# Patient Record
Sex: Male | Born: 1985 | Race: White | Hispanic: No | Marital: Married
Health system: Southern US, Community
[De-identification: ages and names within clinical notes are randomized; demographics above are authoritative.]

## PROBLEM LIST (undated history)

## (undated) DIAGNOSIS — J309 Allergic rhinitis, unspecified: Secondary | ICD-10-CM

## (undated) HISTORY — PX: VASECTOMY: SHX75

## (undated) HISTORY — DX: Allergic rhinitis, unspecified: J30.9

---

## 2017-09-04 DIAGNOSIS — J302 Other seasonal allergic rhinitis: Secondary | ICD-10-CM | POA: Insufficient documentation

## 2018-04-02 ENCOUNTER — Ambulatory Visit (INDEPENDENT_AMBULATORY_CARE_PROVIDER_SITE_OTHER): Payer: BLUE CROSS/BLUE SHIELD

## 2018-04-02 ENCOUNTER — Other Ambulatory Visit: Payer: Self-pay | Admitting: Podiatry

## 2018-04-02 ENCOUNTER — Ambulatory Visit (INDEPENDENT_AMBULATORY_CARE_PROVIDER_SITE_OTHER): Payer: BLUE CROSS/BLUE SHIELD | Admitting: Podiatry

## 2018-04-02 DIAGNOSIS — M7661 Achilles tendinitis, right leg: Secondary | ICD-10-CM

## 2018-04-02 DIAGNOSIS — S99921A Unspecified injury of right foot, initial encounter: Secondary | ICD-10-CM

## 2018-04-02 MED ORDER — METHYLPREDNISOLONE 4 MG PO TBPK: ORAL_TABLET | ORAL | 0 refills | Status: DC

## 2018-04-02 MED ORDER — MELOXICAM 15 MG PO TABS: 15.0000 mg | ORAL_TABLET | Freq: Every day | ORAL | 1 refills | Status: AC

## 2018-04-05 NOTE — Progress Notes (Signed)
   HPI: 32 year old male presenting today as a new patient with a chief complaint of pain to the achilles area of the right lower extremity that began 2 days ago. He reports associated redness and swelling. Bearing weight and walking increases the pain. He has not had any treatment of the symptoms. Patient is here for further evaluation and treatment.   No past medical history on file.    Physical Exam: General: The patient is alert and oriented x3 in no acute distress.  Dermatology: Skin is warm, dry and supple bilateral lower extremities. Negative for open lesions or macerations.  Vascular: Palpable pedal pulses bilaterally. No edema or erythema noted. Capillary refill within normal limits.  Neurological: Epicritic and protective threshold grossly intact bilaterally.   Musculoskeletal Exam: Pain on palpation noted to the posterior tubercle of the right calcaneus at the insertion of the Achilles tendon consistent with retrocalcaneal bursitis. Range of motion within normal limits. Muscle strength 5/5 in all muscle groups bilateral lower extremities.  Radiographic Exam:  Posterior calcaneal spur noted to the respective calcaneus on lateral view. No fracture or dislocation noted. Normal osseous mineralization noted.     Assessment: 1. Insertional Achilles tendinitis right  2. Retrocalcaneal bursitis   Plan of Care:  1. Patient was evaluated. Radiographs were reviewed today. 2. Prescription for Medrol Dose Pak provided to patient.  3. Prescription for Meloxicam provided to patient.  4. Recommended good shoe gear.  5. Return to clinic as needed.    Felecia Shelling, DPM [facility]  Dr. Felecia Shelling, DPM    [address]                                        [place], [place] [address]                Office [phone]  Fax [phone]

## 2018-04-23 ENCOUNTER — Ambulatory Visit (INDEPENDENT_AMBULATORY_CARE_PROVIDER_SITE_OTHER): Payer: BLUE CROSS/BLUE SHIELD | Admitting: Podiatry

## 2018-04-23 DIAGNOSIS — M7661 Achilles tendinitis, right leg: Secondary | ICD-10-CM

## 2018-04-24 NOTE — Progress Notes (Signed)
   HPI: 32 year old male presenting today for follow up evaluation of insertional achilles tendinitis of the right lower extremity. He reports some improvement in the pain but states it is not completely resolved. Walking for long periods of time make the symptoms worse. He has been taking Meloxicam as directed which seems to help alleviate the pain. Patient is here for further evaluation and treatment.   No past medical history on file.    Physical Exam: General: The patient is alert and oriented x3 in no acute distress.  Dermatology: Skin is warm, dry and supple bilateral lower extremities. Negative for open lesions or macerations.  Vascular: Palpable pedal pulses bilaterally. No edema or erythema noted. Capillary refill within normal limits.  Neurological: Epicritic and protective threshold grossly intact bilaterally.   Musculoskeletal Exam: Pain on palpation noted to the posterior tubercle of the right calcaneus at the insertion of the Achilles tendon consistent with retrocalcaneal bursitis. Range of motion within normal limits. Muscle strength 5/5 in all muscle groups bilateral lower extremities.   Assessment: 1. Insertional Achilles tendinitis right - improved  2. Retrocalcaneal bursitis - improved    Plan of Care:  1. Patient was evaluated.  2. Continue taking Meloxicam. 3. Silicone heel sleeve dispensed.  4. Discussed physical therapy, injections or CAM boot if symptoms worsen.  5. Return to clinic as needed.   Felecia Shelling, DPM [facility]  Dr. Felecia Shelling, DPM    [address]                                        [place], [place] [address]                Office [phone]  Fax [phone]

## 2021-02-07 ENCOUNTER — Other Ambulatory Visit: Payer: Self-pay | Admitting: Infectious Diseases

## 2021-02-07 DIAGNOSIS — R7401 Elevation of levels of liver transaminase levels: Secondary | ICD-10-CM

## 2021-02-15 ENCOUNTER — Ambulatory Visit: Payer: BLUE CROSS/BLUE SHIELD

## 2021-02-24 ENCOUNTER — Other Ambulatory Visit: Payer: Self-pay

## 2021-02-24 ENCOUNTER — Ambulatory Visit
Admission: RE | Admit: 2021-02-24 | Discharge: 2021-02-24 | Disposition: A | Discharge status: Home / Self Care | Payer: BC Managed Care – PPO | Source: Ambulatory Visit | Attending: Infectious Diseases | Admitting: Infectious Diseases

## 2021-02-24 DIAGNOSIS — R7401 Elevation of levels of liver transaminase levels: Secondary | ICD-10-CM | POA: Insufficient documentation

## 2021-08-24 DIAGNOSIS — S0502XA Injury of conjunctiva and corneal abrasion without foreign body, left eye, initial encounter: Secondary | ICD-10-CM | POA: Insufficient documentation

## 2022-04-05 IMAGING — US US ABDOMEN LIMITED RUQ/ASCITES
1 series · 14 of 25 positions shown · non-contrast
Comparison: None.

CLINICAL DATA: Elevated ALT measurement

EXAM:
ULTRASOUND ABDOMEN LIMITED RIGHT UPPER QUADRANT

[Series 1: us abdomen limited ruq (liver/gb) · 14 of 55 slices shown]
[im 1/55]
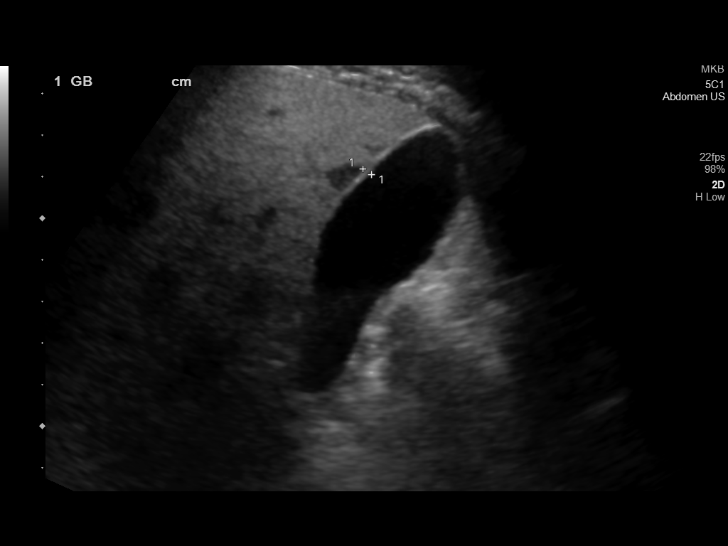
[im 5/55]
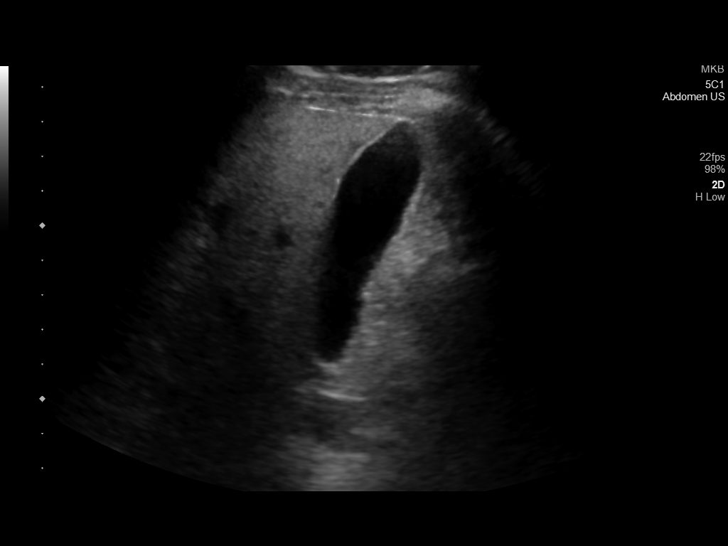
[im 10/55]
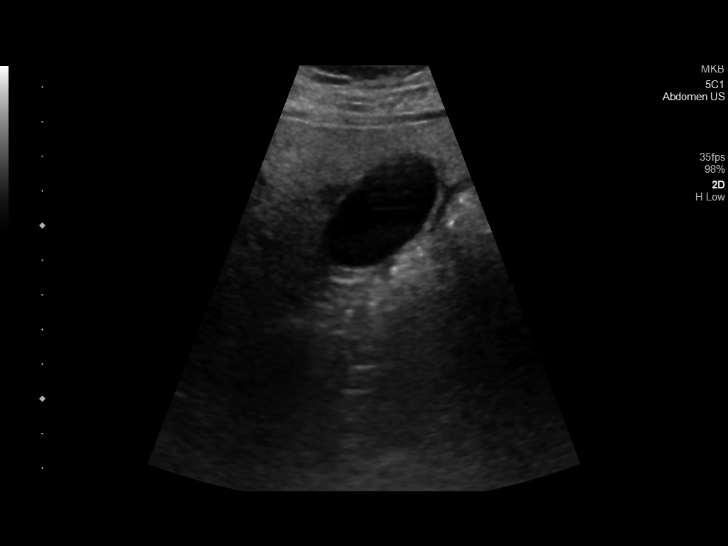
[im 14/55]
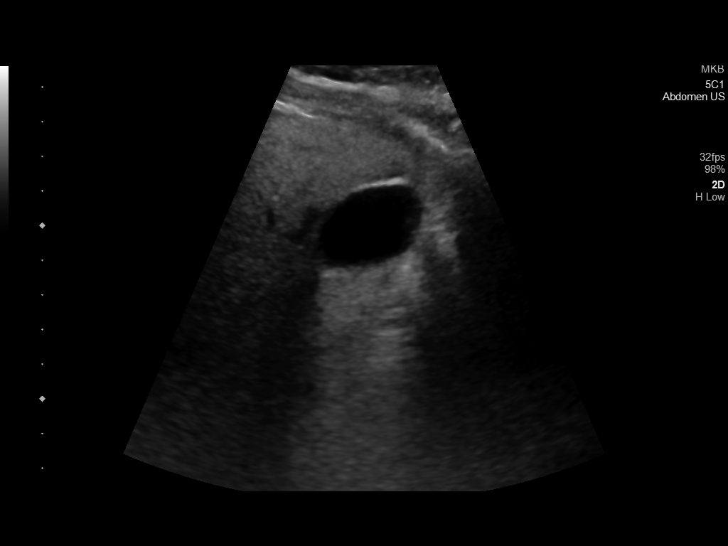
[im 19/55]
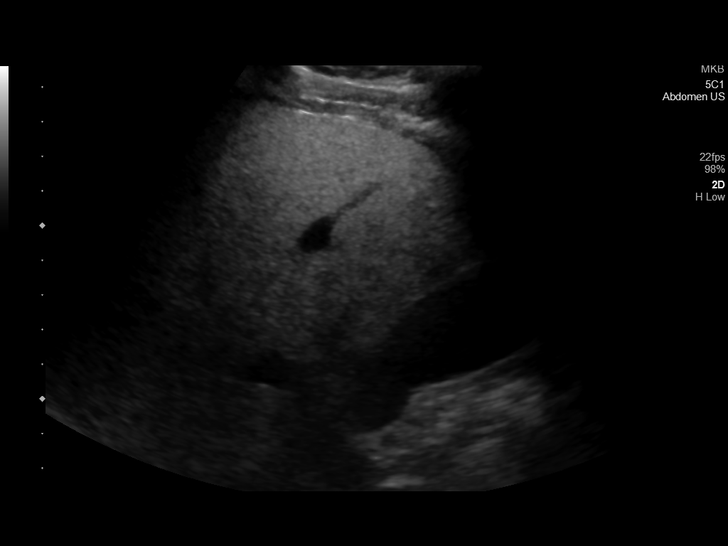
[im 21/55]
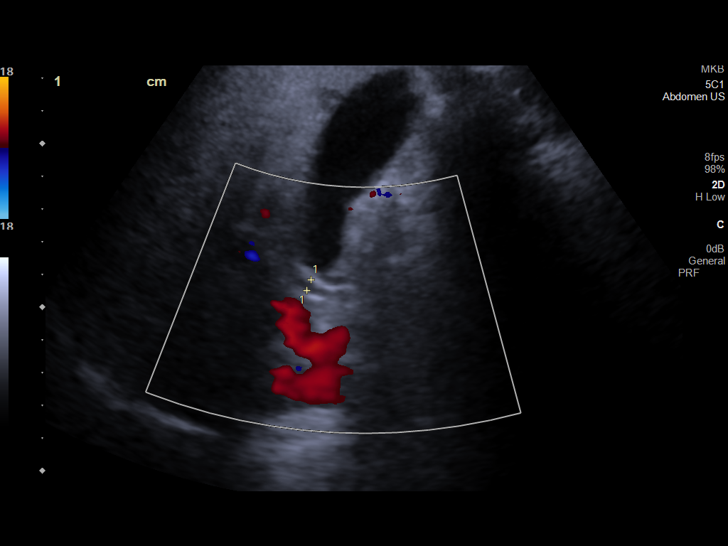
[im 25/55]
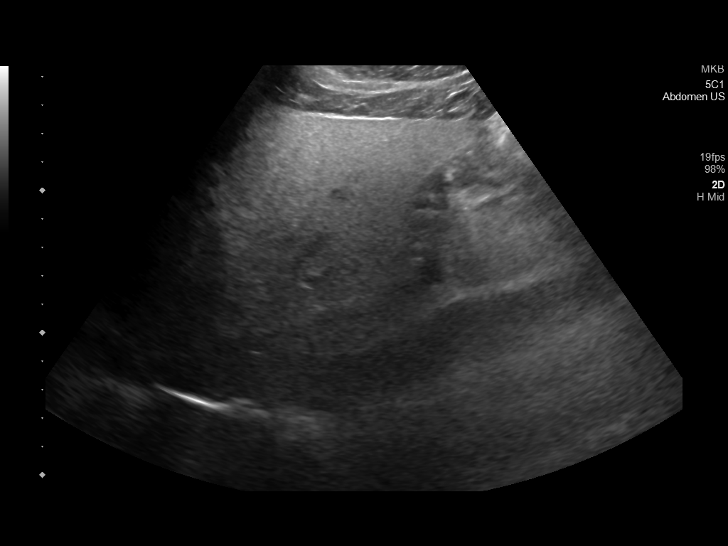
[im 30/55]
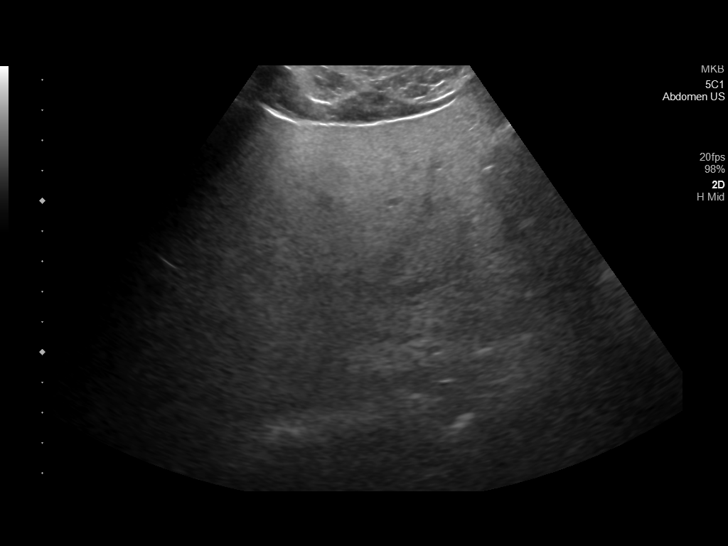
[im 34/55]
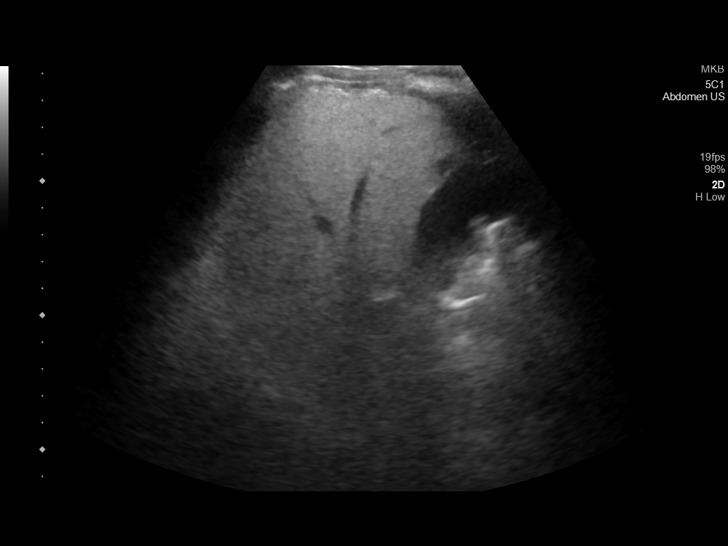
[im 37/55]
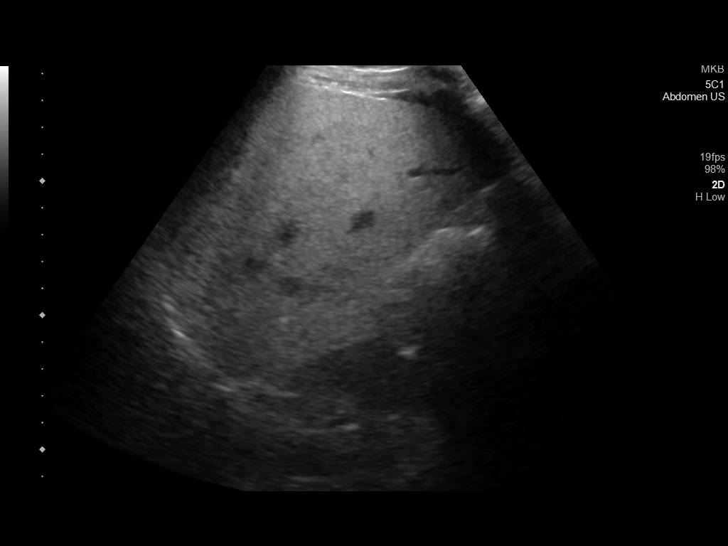
[im 41/55]
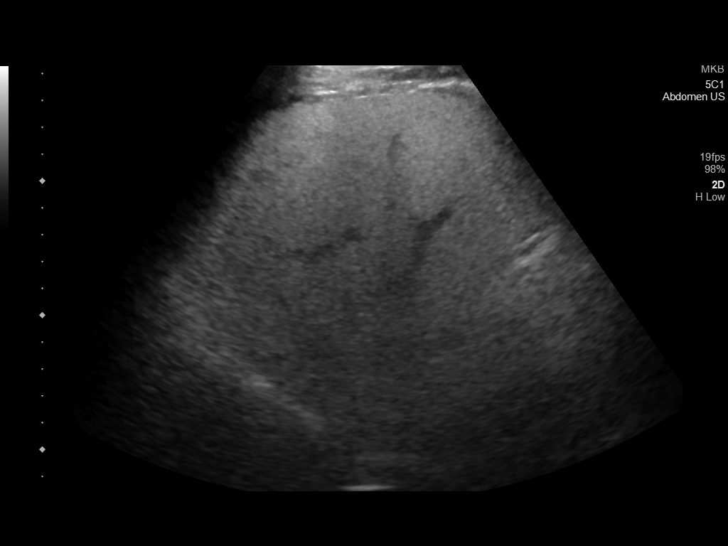
[im 46/55]
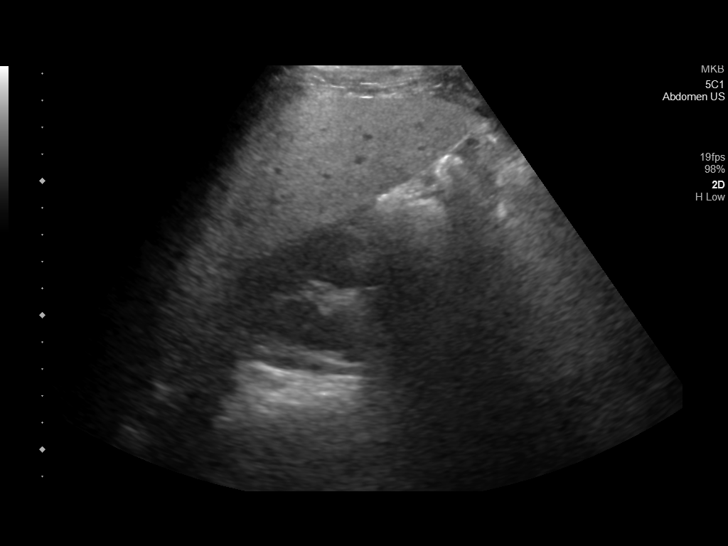
[im 50/55]
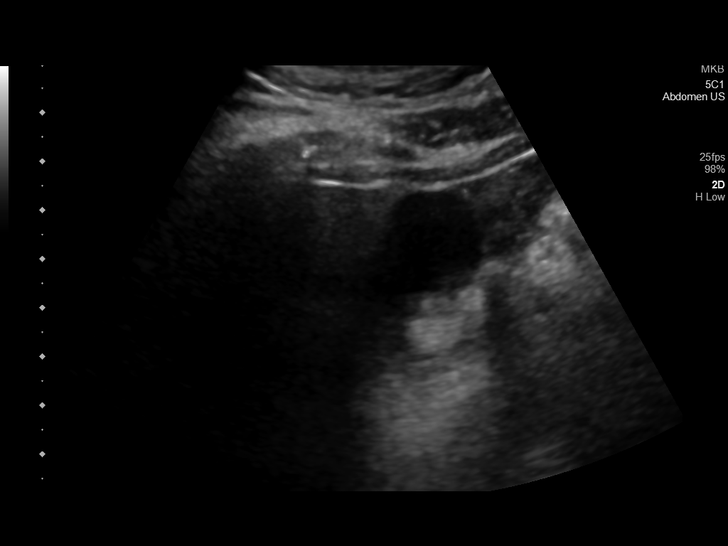
[im 55/55]
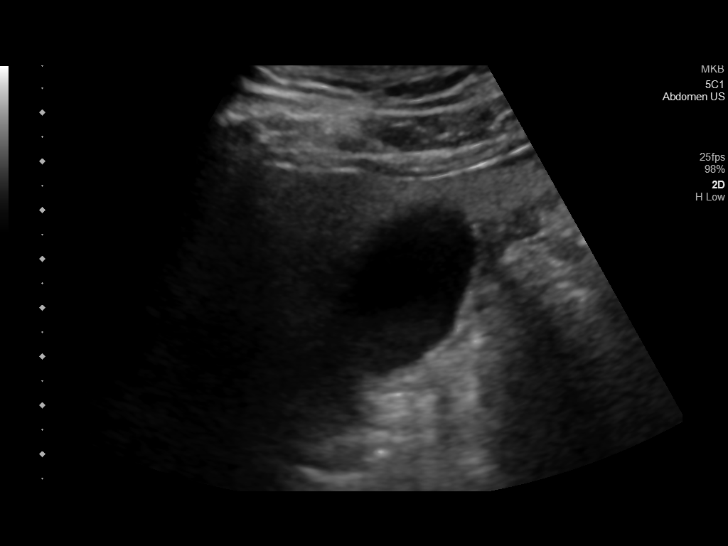

[14 of 25 positions shown; findings below may reference images not displayed]

FINDINGS: Gallbladder:

No gallstones or wall thickening visualized. No sonographic Murphy
sign noted by sonographer.

Common bile duct:

Diameter: 4 mm

Liver:

No focal lesion.

Diffusely increased parenchymal echogenicity, with sparing along the
gallbladder fossa.

Portal vein is patent on color Doppler imaging with normal direction
of blood flow towards the liver.

Other: None.
IMPRESSION: Diffuse increased echogenicity of the hepatic parenchyma is a
nonspecific indicator of hepatocellular dysfunction, most commonly
steatosis.

## 2023-01-16 DIAGNOSIS — H3589 Other specified retinal disorders: Secondary | ICD-10-CM | POA: Insufficient documentation

## 2023-10-24 ENCOUNTER — Other Ambulatory Visit: Payer: Self-pay | Admitting: Infectious Diseases

## 2023-10-24 DIAGNOSIS — E785 Hyperlipidemia, unspecified: Secondary | ICD-10-CM

## 2023-10-24 DIAGNOSIS — Z8249 Family history of ischemic heart disease and other diseases of the circulatory system: Secondary | ICD-10-CM

## 2023-10-24 DIAGNOSIS — R5383 Other fatigue: Secondary | ICD-10-CM

## 2023-11-08 ENCOUNTER — Ambulatory Visit
Admission: RE | Admit: 2023-11-08 | Discharge: 2023-11-08 | Disposition: A | Discharge status: Home / Self Care | Payer: Self-pay | Source: Ambulatory Visit | Attending: Infectious Diseases | Admitting: Infectious Diseases

## 2023-11-08 DIAGNOSIS — E785 Hyperlipidemia, unspecified: Secondary | ICD-10-CM | POA: Insufficient documentation

## 2023-11-08 DIAGNOSIS — Z8249 Family history of ischemic heart disease and other diseases of the circulatory system: Secondary | ICD-10-CM | POA: Insufficient documentation

## 2023-11-08 DIAGNOSIS — R5383 Other fatigue: Secondary | ICD-10-CM | POA: Insufficient documentation

## 2024-02-12 ENCOUNTER — Ambulatory Visit: Payer: Self-pay | Admitting: Urology

## 2024-02-12 ENCOUNTER — Encounter: Payer: Self-pay | Admitting: Urology

## 2024-02-12 VITALS — BP 150/80 | HR 76

## 2024-02-12 DIAGNOSIS — N5312 Painful ejaculation: Secondary | ICD-10-CM | POA: Diagnosis not present

## 2024-02-12 MED ORDER — MELOXICAM 7.5 MG PO TABS: 7.5000 mg | ORAL_TABLET | Freq: Every day | ORAL | 0 refills | Status: AC

## 2024-02-12 MED ORDER — TAMSULOSIN HCL 0.4 MG PO CAPS
0.4000 mg | ORAL_CAPSULE | Freq: Every day | ORAL | 0 refills | Status: DC
Start: 2024-02-12 — End: 2024-04-01

## 2024-02-12 NOTE — Progress Notes (Signed)
 I,Amy L Pierron,acting as a scribe for Vanna Scotland, MD.,have documented all relevant documentation on the behalf of Vanna Scotland, MD,as directed by  Vanna Scotland, MD while in the presence of Vanna Scotland, MD.  02/12/2024 7:16 PM   Alvin Wilson 1986-07-06 [id number]  Referring provider: Mick Sell, MD [address] [place],  [place] [address]  Chief Complaint  Patient presents with   Establish Care    HPI: 38 year-old male referred for further evaluation of painful ejaculation.  He was previously a patient of Dr. Evelene Croon and those records have been obtained. He was last seen there in February 2020. He underwent vasectomy in 2019.     Androgen Deficiency in the Aging Male     Row Name 02/12/24 1400         Androgen Deficiency in the Aging Male   Do you have a decrease in libido (sex drive) No     Do you have lack of energy Yes     Do you have a decrease in strength and/or endurance No     Have you lost height No     Have you noticed a decreased "enjoyment of life" No     Are you sad and/or grumpy No     Are your erections less strong No     Have you noticed a recent deterioration in your ability to play sports No     Are you falling asleep after dinner No     Has there been a recent deterioration in your work performance No              SHIM     Row Name 02/12/24 1444         SHIM: Over the last 6 months:   How do you rate your confidence that you could get and keep an erection? Very High     When you had erections with sexual stimulation, how often were your erections hard enough for penetration (entering your partner)? Almost Always or Always     During sexual intercourse, how often were you able to maintain your erection after you had penetrated (entered) your partner? Almost Always or Always     During sexual intercourse, how difficult was it to maintain your erection to completion of intercourse? Not Difficult     When you attempted  sexual intercourse, how often was it satisfactory for you? Almost Always or Always       SHIM Total Score   SHIM 25               PMH: Past Medical History:  Diagnosis Date   Allergic rhinitis     Surgical History: Past Surgical History:  Procedure Laterality Date   VASECTOMY      Home Medications:  Allergies as of 02/12/2024       Reactions   Prednisone Other (See Comments)   Other Reaction(s): Vision impairment level Eye issues   Kiwi Extract    Other reaction(s): Other (See Comments) "itchy throat"        Medication List        Accurate as of February 12, 2024  7:16 PM. If you have any questions, ask your nurse or doctor.          STOP taking these medications    methylPREDNISolone 4 MG Tbpk tablet Commonly known as: MEDROL DOSEPAK Stopped by: Vanna Scotland       TAKE these medications    IBUPROFEN PO Take  by mouth.   meloxicam 7.5 MG tablet Commonly known as: Mobic Take 1 tablet (7.5 mg total) by mouth daily. Started by: Vanna Scotland   ONE-A-DAY MENS PO Take by mouth.   tamsulosin 0.4 MG Caps capsule Commonly known as: FLOMAX Take 1 capsule (0.4 mg total) by mouth daily. Started by: Vanna Scotland        Allergies:  Allergies  Allergen Reactions   Prednisone Other (See Comments)    Other Reaction(s): Vision impairment level  Eye issues   Kiwi Extract     Other reaction(s): Other (See Comments) "itchy throat"    Social History:  reports that he has quit smoking. His smoking use included cigarettes. He has never used smokeless tobacco. He reports current alcohol use. He reports that he does not use drugs.   Physical Exam: BP (!) 150/80   Pulse 76   Constitutional:  Alert and oriented, No acute distress. HEENT: [place] AT, moist mucus membranes.  Trachea midline, no masses. GU:  Neurologic: Grossly intact, no focal deficits, moving all 4 extremities. Psychiatric: Normal mood and affect.   Assessment & Plan:    1.  Painful ejaculation  No follow-ups on file.  [facility] [address] [place], [place] [address] [phone]

## 2024-02-13 ENCOUNTER — Encounter: Payer: Self-pay | Admitting: Urology

## 2024-02-13 LAB — PSA: Prostate Specific Ag, Serum: 0.5 ng/mL (ref 0.0–4.0)

## 2024-02-14 NOTE — Progress Notes (Signed)
 I,Amy L Pierron,acting as a scribe for Vanna Scotland, MD.,have documented all relevant documentation on the behalf of Vanna Scotland, MD,as directed by  Vanna Scotland, MD while in the presence of Vanna Scotland, MD.  02/12/2024 10:59 AM   Alvin Wilson 1986-06-11 [id number]  Referring provider: Mick Sell, MD [address] [place],  [place] [address]  Chief Complaint  Patient presents with   Establish Care    HPI: 38 year-old male referred for further evaluation of painful ejaculation.  He was previously a patient of Dr. Evelene Croon and those records have been obtained. He was last seen there in February 2020. He underwent vasectomy in 2019.   He reports experiencing pain in the lower pubic area, reminiscent of post-vasectomy soreness, occurring a few seconds before climax. This pain has been present intermittently for over a year, varying in intensity, and is not associated with specific positions or frequency of sexual activity. Recently, he experienced a dull ache in the left testicle radiating to the hip after intercourse, which is a new symptom. No significant changes in ejaculation or sexual function.   He denies erectile dysfunction, blood in ejaculate, or significant impact on sexual activity. He suspects a possible hernia, as suggested by a previous physician. He is concerned about the possibility of an obstruction in the ejaculatory duct or other underlying issues.  He is sexually active, engaging in intercourse three to five times a week.   No family history of prostate cancer.   Androgen Deficiency in the Aging Male     Row Name 02/12/24 1400         Androgen Deficiency in the Aging Male   Do you have a decrease in libido (sex drive) No     Do you have lack of energy Yes     Do you have a decrease in strength and/or endurance No     Have you lost height No     Have you noticed a decreased "enjoyment of life" No     Are you sad and/or grumpy No     Are  your erections less strong No     Have you noticed a recent deterioration in your ability to play sports No     Are you falling asleep after dinner No     Has there been a recent deterioration in your work performance No              SHIM     Row Name 02/12/24 1444         SHIM: Over the last 6 months:   How do you rate your confidence that you could get and keep an erection? Very High     When you had erections with sexual stimulation, how often were your erections hard enough for penetration (entering your partner)? Almost Always or Always     During sexual intercourse, how often were you able to maintain your erection after you had penetrated (entered) your partner? Almost Always or Always     During sexual intercourse, how difficult was it to maintain your erection to completion of intercourse? Not Difficult     When you attempted sexual intercourse, how often was it satisfactory for you? Almost Always or Always       SHIM Total Score   SHIM 25              PMH: Past Medical History:  Diagnosis Date   Allergic rhinitis     Surgical History: Past  Surgical History:  Procedure Laterality Date   VASECTOMY      Home Medications:  Allergies as of 02/12/2024       Reactions   Prednisone Other (See Comments)   Other Reaction(s): Vision impairment level Eye issues   Kiwi Extract    Other reaction(s): Other (See Comments) "itchy throat"        Medication List        Accurate as of February 12, 2024 11:59 PM. If you have any questions, ask your nurse or doctor.          STOP taking these medications    methylPREDNISolone 4 MG Tbpk tablet Commonly known as: MEDROL DOSEPAK Stopped by: Vanna Scotland       TAKE these medications    IBUPROFEN PO Take by mouth.   meloxicam 7.5 MG tablet Commonly known as: Mobic Take 1 tablet (7.5 mg total) by mouth daily. Started by: Vanna Scotland   ONE-A-DAY MENS PO Take by mouth.   tamsulosin 0.4 MG Caps  capsule Commonly known as: FLOMAX Take 1 capsule (0.4 mg total) by mouth daily. Started by: Vanna Scotland        Allergies:  Allergies  Allergen Reactions   Prednisone Other (See Comments)    Other Reaction(s): Vision impairment level  Eye issues   Kiwi Extract     Other reaction(s): Other (See Comments) "itchy throat"    Social History:  reports that he has quit smoking. His smoking use included cigarettes. He has never used smokeless tobacco. He reports current alcohol use. He reports that he does not use drugs.   Physical Exam: BP (!) 150/80   Pulse 76   Constitutional:  Alert and oriented, No acute distress. HEENT: [place] AT, moist mucus membranes.  Trachea midline, no masses. GU:  Left testicle riding slightly high, with very mild tenderness of his left epididymis.  Testicles otherwise normal.. No hernia detected upon examination. Inguinal ring palpated without significant findings. Neurologic: Grossly intact, no focal deficits, moving all 4 extremities. Psychiatric: Normal mood and affect.   Assessment & Plan:    1. Ejaculatory pain - The pain is likely due to contraction of the seminal vesicles or a possible obstruction of the ejaculatory duct or possibly prostatitis. Reviewed the anatomy and process using educational visual aids. - Initiate treatment with Meloxicam 7.5 mg daily for 30 days to address potential inflammation. - Prescribe Flomax to relax prostate muscle fibers, with the understanding that it may cause retrograde ejaculation as a side effect. - Monitor for improvement and reassess if symptoms persist. Consider MRI if no improvement to rule out prostatic duct obstruction or other structural abnormalities. - Order PSA test to rule out prostate cancer, although the patient is young for typical screening. This is a precautionary measure given the symptoms.  Return if symptoms worsen or fail to improve.  I have reviewed the above documentation for accuracy  and completeness, and I agree with the above.   Vanna Scotland, MD   [facility] [address] [place], [place] [address] [phone]

## 2024-03-07 ENCOUNTER — Other Ambulatory Visit: Payer: Self-pay | Admitting: Urology

## 2024-03-07 DIAGNOSIS — N5312 Painful ejaculation: Secondary | ICD-10-CM

## 2024-04-01 ENCOUNTER — Ambulatory Visit (INDEPENDENT_AMBULATORY_CARE_PROVIDER_SITE_OTHER): Admitting: Urology

## 2024-04-01 ENCOUNTER — Other Ambulatory Visit: Payer: Self-pay | Admitting: Urology

## 2024-04-01 ENCOUNTER — Ambulatory Visit
Admission: RE | Admit: 2024-04-01 | Discharge: 2024-04-01 | Disposition: A | Discharge status: Home / Self Care | Source: Ambulatory Visit | Attending: Urology | Admitting: Urology

## 2024-04-01 ENCOUNTER — Encounter: Payer: Self-pay | Admitting: Urology

## 2024-04-01 VITALS — BP 119/76 | HR 86 | Ht 68.0 in | Wt 155.0 lb

## 2024-04-01 DIAGNOSIS — N5312 Painful ejaculation: Secondary | ICD-10-CM | POA: Diagnosis not present

## 2024-04-01 DIAGNOSIS — N4400 Torsion of testis, unspecified: Secondary | ICD-10-CM | POA: Diagnosis not present

## 2024-04-01 DIAGNOSIS — N5082 Scrotal pain: Secondary | ICD-10-CM

## 2024-04-01 NOTE — Progress Notes (Unsigned)
 04/01/2024 5:31 PM   Natalia Bailer 18-Jul-1986 [id number]  Referring provider: Eartha Gold, MD [address] [place],  [place] [address]  Urological history: 1.     Chief Complaint  Patient presents with   Flank Pain    HPI: Alvin Wilson is a 38 y.o. man who presents today for flank pain.  Previous records reviewed.   He was seen by Dr. Ace Holder on February 12, 2024 for ejaculatory pain.  He was started on meloxicam  7.5 mg daily for 30 days and also tamsulosin  0.4 mg daily.   His PSA returned at 0.5.  He did not take the tamsulosin  because he was not comfortable with the possible side effects.  He did take the meloxicam  for 1 week, but he became tired, draggy and with malaise so he discontinued the medication he states over the last 3 weeks he has had pain in the back of the left testicle that radiates into the spermatic cord into the left flank.  He has noted that his left testicle seems to be riding higher than he previously noticed although Dr. Ace Holder mentioned it during her exam in March.  He states it is a dull pain that happens on intermittent basis.  The dull pain can last a minute or 2.  It is aggravated by palpation and arousal.  It is also painful during intercourse.  He states the painful ejaculation has abated.  He is not concerned for any possible STI as he believes he and his wife are faithful to each other.  He has not had any penile discharge.  He denies any known trauma to the testicles.  Patient denies any modifying or aggravating factors.  Patient denies any recent UTI's, gross hematuria, dysuria or suprapubic/flank pain.  Patient denies any fevers, chills, nausea or vomiting.  His recent UA at Dr. Harwood Lingo office was negative.  PMH: Past Medical History:  Diagnosis Date   Allergic rhinitis     Surgical History: Past Surgical History:  Procedure Laterality Date   VASECTOMY      Home Medications:  Allergies as of 04/01/2024       Reactions    Prednisone Other (See Comments)   Other Reaction(s): Vision impairment level Eye issues   Kiwi Extract    Other reaction(s): Other (See Comments) "itchy throat"        Medication List        Accurate as of Apr 01, 2024  5:31 PM. If you have any questions, ask your nurse or doctor.          STOP taking these medications    tamsulosin  0.4 MG Caps capsule Commonly known as: FLOMAX  Stopped by: Alvin Wilson       TAKE these medications    IBUPROFEN PO Take by mouth.   meloxicam  7.5 MG tablet Commonly known as: Mobic  Take 1 tablet (7.5 mg total) by mouth daily.   ONE-A-DAY MENS PO Take by mouth.        Allergies:  Allergies  Allergen Reactions   Prednisone Other (See Comments)    Other Reaction(s): Vision impairment level  Eye issues   Kiwi Extract     Other reaction(s): Other (See Comments) "itchy throat"    Family History: No family history on file.  Social History:  reports that he has quit smoking. His smoking use included cigarettes. He has never used smokeless tobacco. He reports current alcohol use. He reports that he does not use drugs.  ROS: Pertinent ROS  in HPI  Physical Exam: BP 119/76   Pulse 86   Ht 5\' 8"  (1.727 m)   Wt 155 lb (70.3 kg)   BMI 23.57 kg/m   Constitutional:  Well nourished. Alert and oriented, No acute distress. HEENT: [place] AT, moist mucus membranes.  Trachea midline Cardiovascular: No clubbing, cyanosis, or edema. Respiratory: Normal respiratory effort, no increased work of breathing. GU: No CVA tenderness.  No bladder fullness or masses.  Patient with normal phallus.   Urethral meatus is patent.  No penile discharge. No penile lesions or rashes. Scrotum without lesions, cysts, rashes and/or edema.  Testicles are located scrotally bilaterally. No masses are appreciated in the testicles. Left and right epididymis are normal.  The left testicle is high riding.  Cremasteric reflex intact. Neurologic: Grossly intact, no  focal deficits, moving all 4 extremities. Psychiatric: Normal mood and affect.  Laboratory Data: Urinalysis See EPIC and HPI  I have reviewed the labs.   Pertinent Imaging: @CT @ @ultrasound @ @KUB @ I have independently reviewed the films.    Assessment & Plan:  ***  1. Left testicular pain - His testicular exam is benign for the exception of the patient's statement of it being high riding - An abundance of caution I obtained a stat scrotal ultrasound to rule out any intermittent torsion  No follow-ups on file.  These notes generated with voice recognition software. I apologize for typographical errors.  Briant Camper  [facility] [address]  [address] [place], [place] [address] [phone]

## 2024-04-15 ENCOUNTER — Ambulatory Visit: Admitting: Urology

## 2024-04-22 ENCOUNTER — Ambulatory Visit
Admission: RE | Admit: 2024-04-22 | Discharge: 2024-04-22 | Disposition: A | Discharge status: Home / Self Care | Source: Ambulatory Visit | Attending: Urology | Admitting: Urology

## 2024-04-22 DIAGNOSIS — N5082 Scrotal pain: Secondary | ICD-10-CM | POA: Insufficient documentation

## 2024-04-22 MED ORDER — GADOBUTROL 1 MMOL/ML IV SOLN
7.0000 mL | Freq: Once | INTRAVENOUS | Status: AC | PRN
  Administered 2024-04-22: 7 mL via INTRAVENOUS

## 2024-05-08 ENCOUNTER — Encounter: Payer: Self-pay | Admitting: Urology

## 2024-05-08 ENCOUNTER — Ambulatory Visit: Admitting: Urology

## 2024-05-08 VITALS — BP 124/72 | HR 79

## 2024-05-08 DIAGNOSIS — N5312 Painful ejaculation: Secondary | ICD-10-CM

## 2024-05-08 NOTE — Progress Notes (Signed)
 05/08/2024 3:25 PM   Natalia Bailer 1986/10/15 [id number]  Referring provider: Eartha Gold, MD [address] [place],  [place] [address]  Urological history: 1.  Undesired fertility - Vasectomy (2020)  Chief Complaint  Patient presents with   Follow-up    HPI: Arben Packman is a 38 y.o. man who presents today for MRI report with his wife, Crystal.   Previous records reviewed.   He was seen by Dr. Ace Holder on February 12, 2024 for ejaculatory pain.  He was started on meloxicam  7.5 mg daily for 30 days and also tamsulosin  0.4 mg daily.   His PSA returned at 0.5.  When I saw him on 04/01/2024, he did not take the tamsulosin  because he was not comfortable with the possible side effects.  He did take the meloxicam  for 1 week, but he became tired, draggy and with malaise, so he discontinued the medication.  He states over the last 3 weeks, he has had pain in the back of the left testicle that radiates into the spermatic cord into the left flank.  He has noted that his left testicle seems to be riding higher than he previously noticed although Dr. Ace Holder mentioned it during her exam in March.  He states it is a dull pain that happens on intermittent basis.  The dull pain can last a minute or 2.  It is aggravated by palpation and arousal.  It is also painful during intercourse.  He states the painful ejaculation has abated.  He is not concerned for any possible STI as he believes he and his wife are faithful to each other.  He has not had any penile discharge.  He denies any known trauma to the testicles.  Patient denies any modifying or aggravating factors.  Patient denies any recent UTI's, gross hematuria, dysuria or suprapubic/flank pain.  Patient denies any fevers, chills, nausea or vomiting.  His recent UA at Dr. Harwood Lingo office was negative.  Stat ultrasound of the scrotum that afternoon noted no evidence of torsion, epididymitis, hydroceles, testicular masses or  varicoceles.  He underwent a pelvic MRI without contrast on Apr 22, 2024 and his penis, testicle and prostate are all normal in appearance and there are no findings to explain his pain.  Today, he states that the testicular pain has completely abated.  The pain after ejaculation is still persistent.  He states that the pain is intermittent and only lasts for a few minutes.  His wife is concerned that it will start to interfere with their intimacy as he will associate pain with sex.  He states ejaculated fluid is normal and has no blood in it.    Patient denies any modifying or aggravating factors.  Patient denies any recent UTI's, gross hematuria, dysuria or suprapubic/flank pain.  Patient denies any fevers, chills, nausea or vomiting.     PMH: Past Medical History:  Diagnosis Date   Allergic rhinitis     Surgical History: Past Surgical History:  Procedure Laterality Date   VASECTOMY      Home Medications:  Allergies as of 05/08/2024       Reactions   Prednisone Other (See Comments)   Other Reaction(s): Vision impairment level Eye issues   Kiwi Extract    Other reaction(s): Other (See Comments) itchy throat        Medication List        Accurate as of May 08, 2024  3:25 PM. If you have any questions, ask your nurse or  doctor.          IBUPROFEN PO Take by mouth.   meloxicam  7.5 MG tablet Commonly known as: Mobic  Take 1 tablet (7.5 mg total) by mouth daily.   ONE-A-DAY MENS PO Take by mouth.        Allergies:  Allergies  Allergen Reactions   Prednisone Other (See Comments)    Other Reaction(s): Vision impairment level  Eye issues   Kiwi Extract     Other reaction(s): Other (See Comments) itchy throat    Family History: No family history on file.  Social History:  reports that he has quit smoking. His smoking use included cigarettes. He has never used smokeless tobacco. He reports current alcohol use. He reports that he does not use  drugs.  ROS: Pertinent ROS in HPI  Physical Exam: BP 124/72   Pulse 79   Constitutional:  Well nourished. Alert and oriented, No acute distress. HEENT: [place] AT, moist mucus membranes.  Trachea midline Cardiovascular: No clubbing, cyanosis, or edema. Respiratory: Normal respiratory effort, no increased work of breathing. Anus and perineum are without rashes or lesions.    Neurologic: Grossly intact, no focal deficits, moving all 4 extremities. Psychiatric: Normal mood and affect.    Laboratory Data: N/A   Pertinent Imaging: Narrative & Impression  CLINICAL DATA:  Scrotal pain, pain with ejaculation on left side of scrotum, vasectomy 5 years ago   EXAM: MRI PELVIS WITHOUT AND WITH CONTRAST   TECHNIQUE: Multiplanar multisequence MR imaging of the pelvis was performed both before and after administration of intravenous contrast.   CONTRAST:  7mL GADAVIST  GADOBUTROL  1 MMOL/ML IV SOLN   COMPARISON:  None Available.   FINDINGS: Urinary Tract:  No abnormality visualized.   Bowel:  Unremarkable visualized pelvic bowel loops.   Vascular/Lymphatic: No pathologically enlarged lymph nodes. No significant vascular abnormality seen.   Reproductive: No mass or other significant abnormality. The penis, testicles, and prostate are normal in appearance.   Other:  None.   Musculoskeletal: No suspicious bone lesions identified.   IMPRESSION: 1. No MRI findings of the pelvis to explain pain. 2. The penis, testicles, and prostate are normal in appearance.     Electronically Signed   By: Fredricka Jenny M.D.   On: 04/23/2024 07:38  I have independently reviewed the films.   See HPI  Assessment & Plan:    1. Left testicular pain - resolved   2.  Ejaculatory pain - Explained that I do not have a explanation for the pain with ejaculation, the patient does not find it bothersome, but his wife is concerned that it will start to affect their sex life as he will associate pain with  sex.   - I offered a second opinion with Dr. Ander Bame in [place] and they are agreeable  Return for Dr. Jarvis Mesa for second opinion .  These notes generated with voice recognition software. I apologize for typographical errors.  Briant Camper  [facility] [address]  [address] [place], [place] [address] [phone]
